# Patient Record
Sex: Female | Born: 1955 | Race: White | Hispanic: No | Marital: Single
Health system: Southern US, Community
[De-identification: ages and names within clinical notes are randomized; demographics above are authoritative.]

---

## 2000-11-16 ENCOUNTER — Ambulatory Visit (HOSPITAL_COMMUNITY): Admission: RE | Admit: 2000-11-16 | Discharge: 2000-11-16 | Payer: Self-pay | Admitting: Obstetrics and Gynecology

## 2016-11-18 ENCOUNTER — Other Ambulatory Visit (HOSPITAL_COMMUNITY): Payer: Self-pay

## 2016-11-18 ENCOUNTER — Inpatient Hospital Stay
Admission: AD | Admit: 2016-11-18 | Discharge: 2016-12-08 | Disposition: A | Discharge status: Skilled Nursing Facility | Payer: Self-pay | Source: Ambulatory Visit | Attending: Internal Medicine | Admitting: Internal Medicine

## 2016-11-18 DIAGNOSIS — J9690 Respiratory failure, unspecified, unspecified whether with hypoxia or hypercapnia: Secondary | ICD-10-CM

## 2016-11-18 DIAGNOSIS — J189 Pneumonia, unspecified organism: Secondary | ICD-10-CM

## 2016-11-18 DIAGNOSIS — T85528A Displacement of other gastrointestinal prosthetic devices, implants and grafts, initial encounter: Secondary | ICD-10-CM

## 2016-11-18 DIAGNOSIS — Z431 Encounter for attention to gastrostomy: Secondary | ICD-10-CM

## 2016-11-18 MED ORDER — IOPAMIDOL (ISOVUE-300) INJECTION 61%
50.0000 mL | Freq: Once | INTRAVENOUS | Status: AC | PRN
  Administered 2016-11-18: 50 mL

## 2016-11-19 ENCOUNTER — Other Ambulatory Visit (HOSPITAL_COMMUNITY): Payer: Self-pay

## 2016-11-19 LAB — COMPREHENSIVE METABOLIC PANEL
ALT: 51 U/L (ref 14–54)
AST: 27 U/L (ref 15–41)
Albumin: 1.8 g/dL — ABNORMAL LOW (ref 3.5–5.0)
Alkaline Phosphatase: 174 U/L — ABNORMAL HIGH (ref 38–126)
Anion gap: 5 (ref 5–15)
BUN: 8 mg/dL (ref 6–20)
CHLORIDE: 96 mmol/L — AB (ref 101–111)
CO2: 32 mmol/L (ref 22–32)
Calcium: 8.7 mg/dL — ABNORMAL LOW (ref 8.9–10.3)
Creatinine, Ser: 0.47 mg/dL (ref 0.44–1.00)
GFR calc Af Amer: >60 mL/min (ref >60)
GFR calc non Af Amer: >60 mL/min (ref >60)
GLUCOSE: 106 mg/dL — AB (ref 65–99)
Potassium: 4.8 mmol/L (ref 3.5–5.1)
SODIUM: 133 mmol/L — AB (ref 135–145)
Total Bilirubin: 0.5 mg/dL (ref 0.3–1.2)
Total Protein: 6.2 g/dL — ABNORMAL LOW (ref 6.5–8.1)

## 2016-11-19 LAB — CBC WITH DIFFERENTIAL/PLATELET
Basophils Absolute: 0 10*3/uL (ref 0.0–0.1)
Basophils Relative: 0 %
EOS ABS: 0.4 10*3/uL (ref 0.0–0.7)
EOS PCT: 4 %
HCT: 29 % — ABNORMAL LOW (ref 36.0–46.0)
Hemoglobin: 8.5 g/dL — ABNORMAL LOW (ref 12.0–15.0)
LYMPHS ABS: 1.1 10*3/uL (ref 0.7–4.0)
Lymphocytes Relative: 13 %
MCH: 25.1 pg — ABNORMAL LOW (ref 26.0–34.0)
MCHC: 29.3 g/dL — AB (ref 30.0–36.0)
MCV: 85.5 fL (ref 78.0–100.0)
MONOS PCT: 8 %
Monocytes Absolute: 0.7 10*3/uL (ref 0.1–1.0)
Neutro Abs: 6.1 10*3/uL (ref 1.7–7.7)
Neutrophils Relative %: 75 %
PLATELETS: 423 10*3/uL — AB (ref 150–400)
RBC: 3.39 MIL/uL — ABNORMAL LOW (ref 3.87–5.11)
RDW: 16 % — ABNORMAL HIGH (ref 11.5–15.5)
WBC: 8.2 10*3/uL (ref 4.0–10.5)

## 2016-11-19 LAB — TSH: TSH: 12.833 u[IU]/mL — ABNORMAL HIGH (ref 0.350–4.500)

## 2016-11-19 LAB — T4, FREE: FREE T4: 1.07 ng/dL (ref 0.61–1.12)

## 2016-11-20 ENCOUNTER — Other Ambulatory Visit (HOSPITAL_COMMUNITY): Payer: Self-pay

## 2016-11-20 MED ORDER — IOPAMIDOL (ISOVUE-300) INJECTION 61%
75.0000 mL | Freq: Once | INTRAVENOUS | Status: AC | PRN
  Administered 2016-11-20: 75 mL via INTRAVENOUS

## 2016-11-21 LAB — CBC
HCT: 29.2 % — ABNORMAL LOW (ref 36.0–46.0)
Hemoglobin: 8.6 g/dL — ABNORMAL LOW (ref 12.0–15.0)
MCH: 24.8 pg — ABNORMAL LOW (ref 26.0–34.0)
MCHC: 29.5 g/dL — ABNORMAL LOW (ref 30.0–36.0)
MCV: 84.1 fL (ref 78.0–100.0)
Platelets: 396 10*3/uL (ref 150–400)
RBC: 3.47 MIL/uL — AB (ref 3.87–5.11)
RDW: 15.7 % — ABNORMAL HIGH (ref 11.5–15.5)
WBC: 8.7 10*3/uL (ref 4.0–10.5)

## 2016-11-23 ENCOUNTER — Other Ambulatory Visit (HOSPITAL_COMMUNITY): Payer: Self-pay

## 2016-11-23 ENCOUNTER — Encounter (HOSPITAL_COMMUNITY): Payer: Self-pay | Admitting: Interventional Radiology

## 2016-11-23 HISTORY — PX: IR CM INJ ANY COLONIC TUBE W/FLUORO: IMG2336

## 2016-11-23 LAB — CBC
HCT: 27.5 % — ABNORMAL LOW (ref 36.0–46.0)
Hemoglobin: 8.1 g/dL — ABNORMAL LOW (ref 12.0–15.0)
MCH: 24.8 pg — ABNORMAL LOW (ref 26.0–34.0)
MCHC: 29.5 g/dL — ABNORMAL LOW (ref 30.0–36.0)
MCV: 84.1 fL (ref 78.0–100.0)
Platelets: 370 10*3/uL (ref 150–400)
RBC: 3.27 MIL/uL — ABNORMAL LOW (ref 3.87–5.11)
RDW: 16.3 % — AB (ref 11.5–15.5)
WBC: 8.3 10*3/uL (ref 4.0–10.5)

## 2016-11-23 LAB — BASIC METABOLIC PANEL
ANION GAP: 7 (ref 5–15)
BUN: 10 mg/dL (ref 6–20)
CALCIUM: 8.6 mg/dL — AB (ref 8.9–10.3)
CHLORIDE: 95 mmol/L — AB (ref 101–111)
CO2: 31 mmol/L (ref 22–32)
Creatinine, Ser: 0.48 mg/dL (ref 0.44–1.00)
GFR calc non Af Amer: >60 mL/min (ref >60)
GLUCOSE: 118 mg/dL — AB (ref 65–99)
POTASSIUM: 4.6 mmol/L (ref 3.5–5.1)
Sodium: 133 mmol/L — ABNORMAL LOW (ref 135–145)

## 2016-11-23 LAB — PHOSPHORUS: PHOSPHORUS: 3.5 mg/dL (ref 2.5–4.6)

## 2016-11-23 LAB — MAGNESIUM: Magnesium: 2 mg/dL (ref 1.7–2.4)

## 2016-11-23 MED ORDER — LIDOCAINE HCL 1 % IJ SOLN
INTRAMUSCULAR | Status: AC
  Filled 2016-11-23: qty 20

## 2016-11-23 MED ORDER — IOPAMIDOL (ISOVUE-300) INJECTION 61%
INTRAVENOUS | Status: AC
  Administered 2016-11-23: 15 mL
  Filled 2016-11-23: qty 50

## 2016-11-26 LAB — BASIC METABOLIC PANEL
Anion gap: 9 (ref 5–15)
BUN: 13 mg/dL (ref 6–20)
CALCIUM: 9.1 mg/dL (ref 8.9–10.3)
CHLORIDE: 95 mmol/L — AB (ref 101–111)
CO2: 30 mmol/L (ref 22–32)
Creatinine, Ser: 0.5 mg/dL (ref 0.44–1.00)
GFR calc Af Amer: >60 mL/min (ref >60)
GFR calc non Af Amer: >60 mL/min (ref >60)
GLUCOSE: 88 mg/dL (ref 65–99)
POTASSIUM: 3.7 mmol/L (ref 3.5–5.1)
Sodium: 134 mmol/L — ABNORMAL LOW (ref 135–145)

## 2016-11-26 LAB — CBC
HEMATOCRIT: 31.5 % — AB (ref 36.0–46.0)
Hemoglobin: 9.4 g/dL — ABNORMAL LOW (ref 12.0–15.0)
MCH: 24.9 pg — AB (ref 26.0–34.0)
MCHC: 29.8 g/dL — AB (ref 30.0–36.0)
MCV: 83.3 fL (ref 78.0–100.0)
Platelets: 395 10*3/uL (ref 150–400)
RBC: 3.78 MIL/uL — ABNORMAL LOW (ref 3.87–5.11)
RDW: 16.2 % — ABNORMAL HIGH (ref 11.5–15.5)
WBC: 6.7 10*3/uL (ref 4.0–10.5)

## 2016-11-27 ENCOUNTER — Other Ambulatory Visit (HOSPITAL_COMMUNITY): Payer: Self-pay

## 2016-12-01 LAB — CBC
HCT: 30 % — ABNORMAL LOW (ref 36.0–46.0)
Hemoglobin: 8.8 g/dL — ABNORMAL LOW (ref 12.0–15.0)
MCH: 24.2 pg — ABNORMAL LOW (ref 26.0–34.0)
MCHC: 29.3 g/dL — ABNORMAL LOW (ref 30.0–36.0)
MCV: 82.4 fL (ref 78.0–100.0)
PLATELETS: 384 10*3/uL (ref 150–400)
RBC: 3.64 MIL/uL — ABNORMAL LOW (ref 3.87–5.11)
RDW: 16.4 % — AB (ref 11.5–15.5)
WBC: 7.3 10*3/uL (ref 4.0–10.5)

## 2016-12-01 LAB — BASIC METABOLIC PANEL
ANION GAP: 8 (ref 5–15)
BUN: 7 mg/dL (ref 6–20)
CALCIUM: 8.8 mg/dL — AB (ref 8.9–10.3)
CO2: 29 mmol/L (ref 22–32)
CREATININE: 0.49 mg/dL (ref 0.44–1.00)
Chloride: 101 mmol/L (ref 101–111)
GFR calc Af Amer: >60 mL/min (ref >60)
GLUCOSE: 81 mg/dL (ref 65–99)
Potassium: 3.8 mmol/L (ref 3.5–5.1)
Sodium: 138 mmol/L (ref 135–145)

## 2016-12-03 LAB — PROTIME-INR
INR: 0.97
PROTHROMBIN TIME: 12.8 s (ref 11.4–15.2)

## 2016-12-07 LAB — RENAL FUNCTION PANEL
Albumin: 2.4 g/dL — ABNORMAL LOW (ref 3.5–5.0)
Anion gap: 8 (ref 5–15)
BUN: 10 mg/dL (ref 6–20)
CALCIUM: 8.6 mg/dL — AB (ref 8.9–10.3)
CHLORIDE: 104 mmol/L (ref 101–111)
CO2: 25 mmol/L (ref 22–32)
CREATININE: 0.55 mg/dL (ref 0.44–1.00)
GFR calc non Af Amer: >60 mL/min (ref >60)
GLUCOSE: 81 mg/dL (ref 65–99)
Phosphorus: 3.4 mg/dL (ref 2.5–4.6)
Potassium: 3.8 mmol/L (ref 3.5–5.1)
SODIUM: 137 mmol/L (ref 135–145)

## 2016-12-07 LAB — CBC
HCT: 31.8 % — ABNORMAL LOW (ref 36.0–46.0)
HEMOGLOBIN: 9.4 g/dL — AB (ref 12.0–15.0)
MCH: 24.1 pg — AB (ref 26.0–34.0)
MCHC: 29.6 g/dL — AB (ref 30.0–36.0)
MCV: 81.5 fL (ref 78.0–100.0)
Platelets: 363 10*3/uL (ref 150–400)
RBC: 3.9 MIL/uL (ref 3.87–5.11)
RDW: 17.5 % — AB (ref 11.5–15.5)
WBC: 7.4 10*3/uL (ref 4.0–10.5)

## 2016-12-07 LAB — MAGNESIUM: MAGNESIUM: 2 mg/dL (ref 1.7–2.4)

## 2017-11-20 ENCOUNTER — Telehealth: Payer: Self-pay

## 2017-11-20 NOTE — Telephone Encounter (Signed)
Returned VM that was left for Judeth Cornfield, Dr. Milta Deiters nurse. Upon chart review, pt being treated at [facility]. Spoke with unidentified family member, who stated pt had the [facility] phone number and was there now, getting an MRI. Delight Stare, RN BSN

## 2018-01-21 DEATH — deceased

## 2019-01-05 IMAGING — CT CT CHEST W/ CM
2 of 4 series · 15 of 36 positions shown, 18 images · IV contrast (APPLIED)
Comparison: CT scan of June 17, 2016.

CLINICAL DATA: Possible lung mass.

EXAM:
CT CHEST WITH CONTRAST
TECHNIQUE: Multidetector CT imaging of the chest was performed during
intravenous contrast administration.
CONTRAST:  75mL [id number] IOPAMIDOL ([id number]) INJECTION 61%

[Series 3: chest wo · axial · 0.64mm/px · z∈[+1161,+1373]mm · 12 of 126 slices shown, 15 images]
[im 10/126  mediastinal]
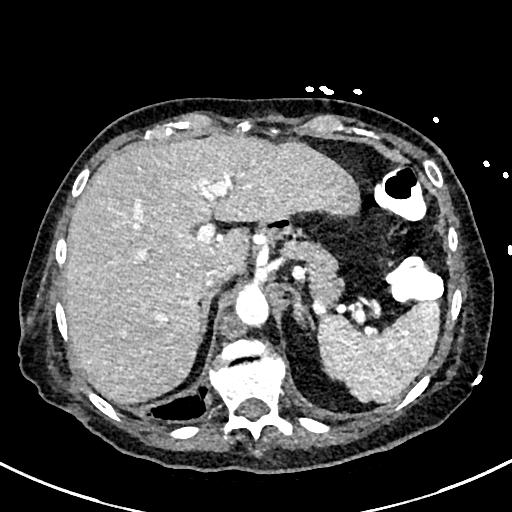
[im 10/126  lung]
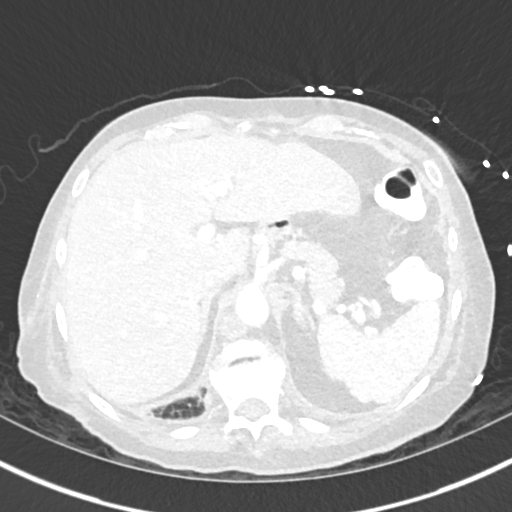
[im 20/126  lung]
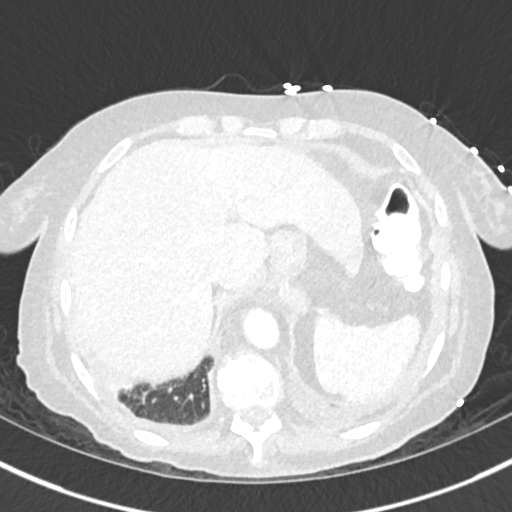
[im 29/126  lung]
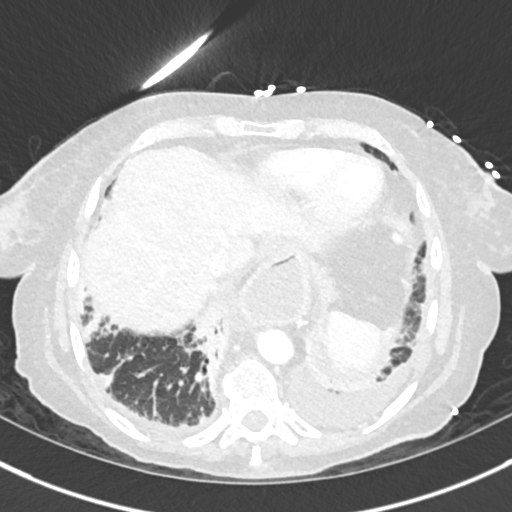
[im 39/126  lung]
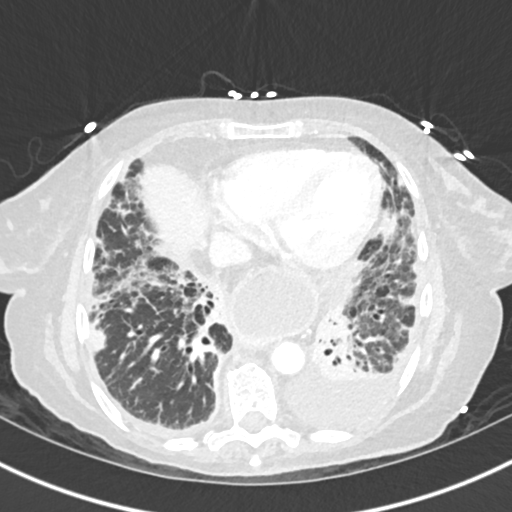
[im 49/126  mediastinal]
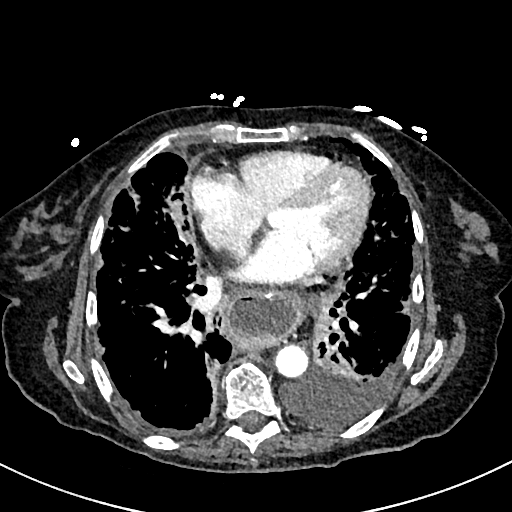
[im 49/126  lung]
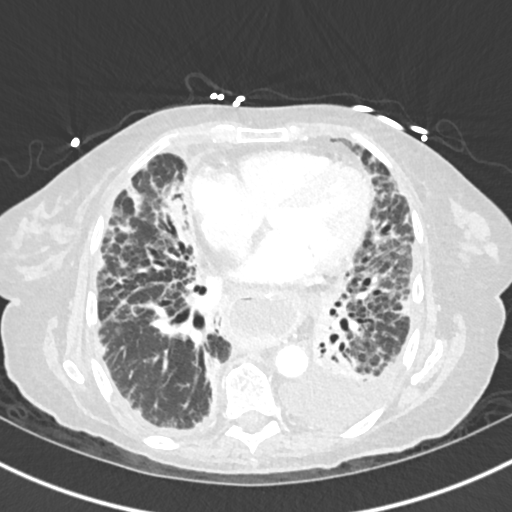
[im 58/126  lung]
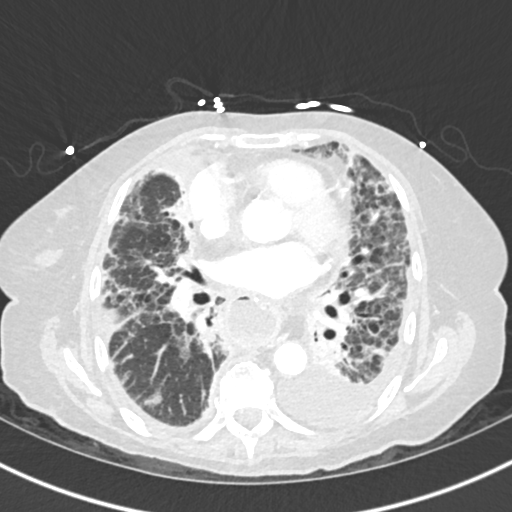
[im 68/126  lung]
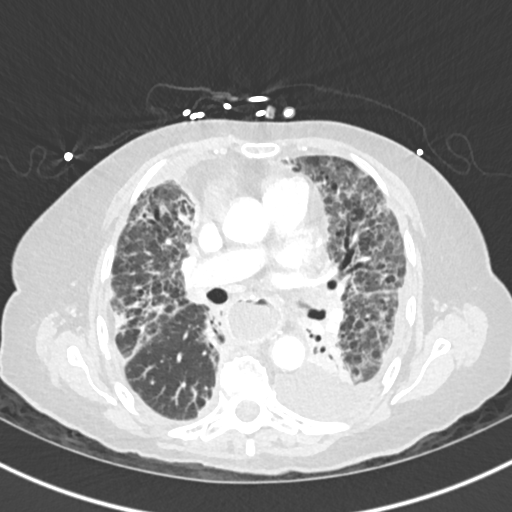
[im 77/126  lung]
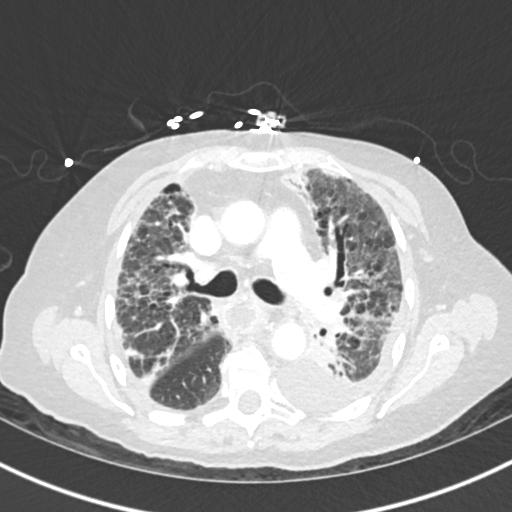
[im 87/126  mediastinal]
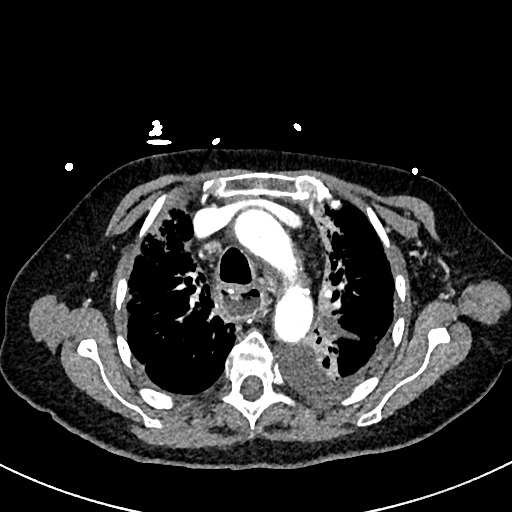
[im 87/126  lung]
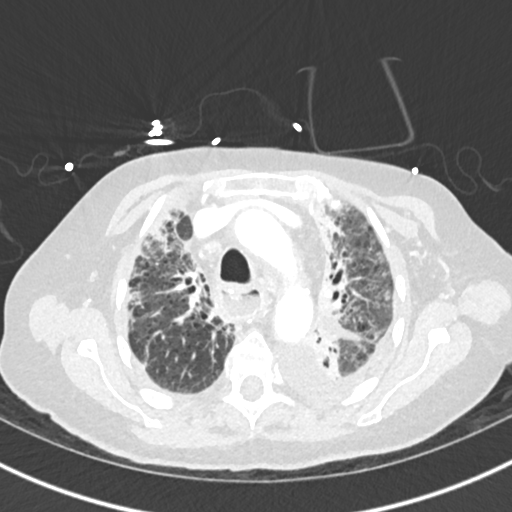
[im 97/126  lung]
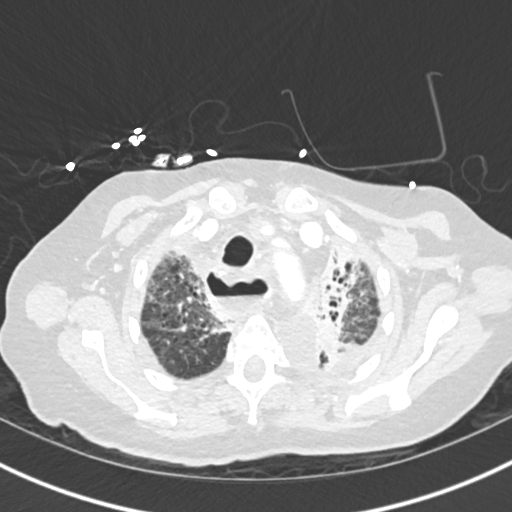
[im 106/126  lung]
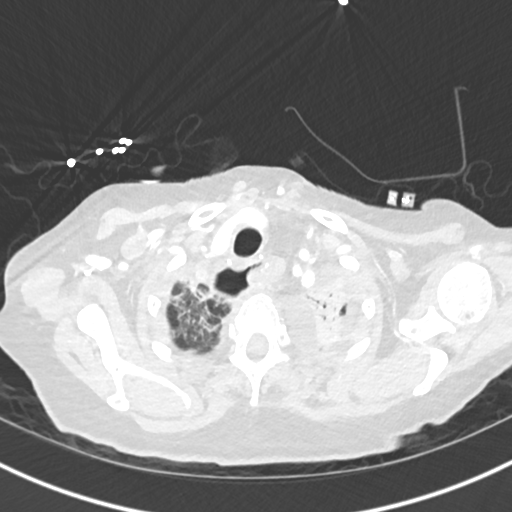
[im 116/126  lung]
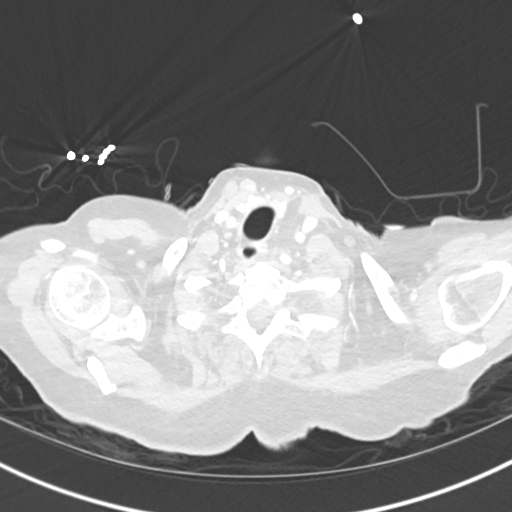

[Series 6: cor · coronal · 0.59mm/px · 3 of 150 slices shown]
[im 30/150  lung]
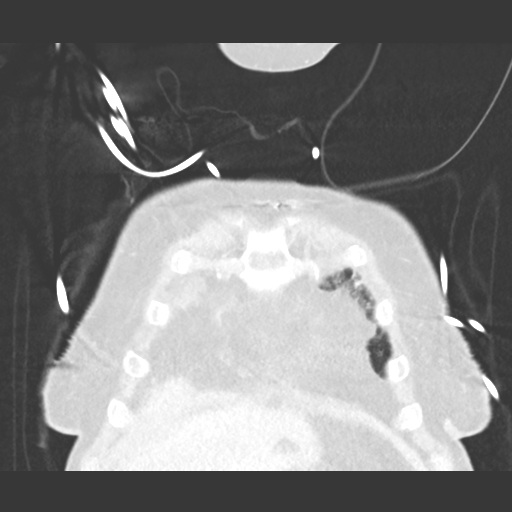
[im 60/150  lung]
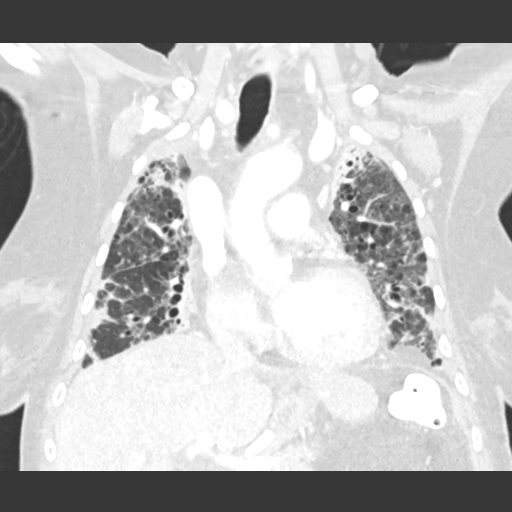
[im 90/150  lung]
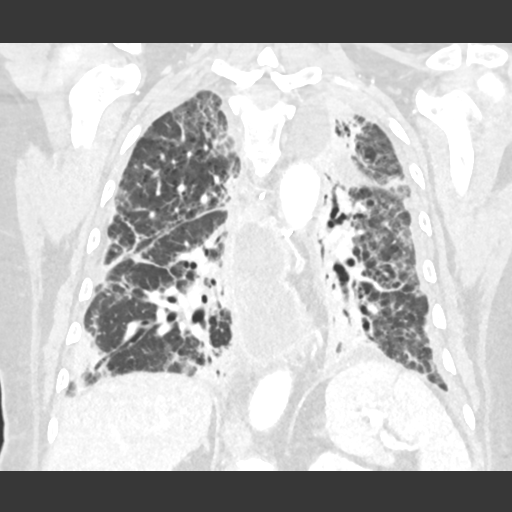

[15 of 36 positions shown; findings below may reference images not displayed]

FINDINGS: Cardiovascular: No significant vascular findings. Normal heart size.
No pericardial effusion.

Mediastinum/Nodes: Status post gastric pull-through procedure for
treatment of esophageal cancer. Trachea and thyroid gland are
unremarkable. 15 mm enlarged lymph node is noted in the right
retrocrural region. No other significant adenopathy is noted.

Lungs/Pleura: No pneumothorax is noted. Mild left pleural effusion
is noted with adjacent subsegmental atelectasis. Coarse interstitial
densities are noted throughout both lungs as well bronchiectasis
seen bilaterally as well. This would suggest radiation fibrosis if
the patient has received that, or may represent severe edema or
atypical inflammation.

Upper Abdomen: No acute abnormality.

Musculoskeletal: No chest wall abnormality. No acute or significant
osseous findings.
IMPRESSION: Status post gastric pull-through procedure for esophageal cancer.

15 mm large lymph node is noted in the right retrocrural region
concerning for metastatic disease.

Mild left pleural effusion is noted with adjacent subsegmental
atelectasis.

Coarse interstitial densities are noted throughout both lungs with
associated atelectasis ; this which suggest radiation fibrosis if
the patient has received that. Otherwise, this is concerning for
severe edema or atypical inflammation.
